# Patient Record
Sex: Male | Born: 1955 | Race: White | Hispanic: No | Marital: Married | Smoking: Former smoker
Health system: Southern US, Community
[De-identification: ages and names within clinical notes are randomized; demographics above are authoritative.]

## PROBLEM LIST (undated history)

## (undated) DIAGNOSIS — I10 Essential (primary) hypertension: Secondary | ICD-10-CM

## (undated) DIAGNOSIS — E785 Hyperlipidemia, unspecified: Secondary | ICD-10-CM

## (undated) DIAGNOSIS — Z8619 Personal history of other infectious and parasitic diseases: Secondary | ICD-10-CM

## (undated) HISTORY — DX: Personal history of other infectious and parasitic diseases: Z86.19

## (undated) HISTORY — DX: Essential (primary) hypertension: I10

## (undated) HISTORY — DX: Hyperlipidemia, unspecified: E78.5

---
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
## 1997-12-27 HISTORY — PX: VASECTOMY: SHX75

## 2008-02-04 ENCOUNTER — Emergency Department (HOSPITAL_COMMUNITY): Admission: EM | Admit: 2008-02-04 | Discharge: 2008-02-04 | Payer: Self-pay | Admitting: Emergency Medicine

## 2009-07-26 IMAGING — CR DG CHEST 2V
2 series · 2 of 2 positions shown · non-contrast
Comparison: none

CLINICAL DATA: Chest pain

Chest 2 view:
No previous for comparison. The heart size and mediastinal contours are within
normal limits.  Both lungs are clear.  The visualized skeletal structures are
unremarkable.

[view not recorded (1 of 2)]
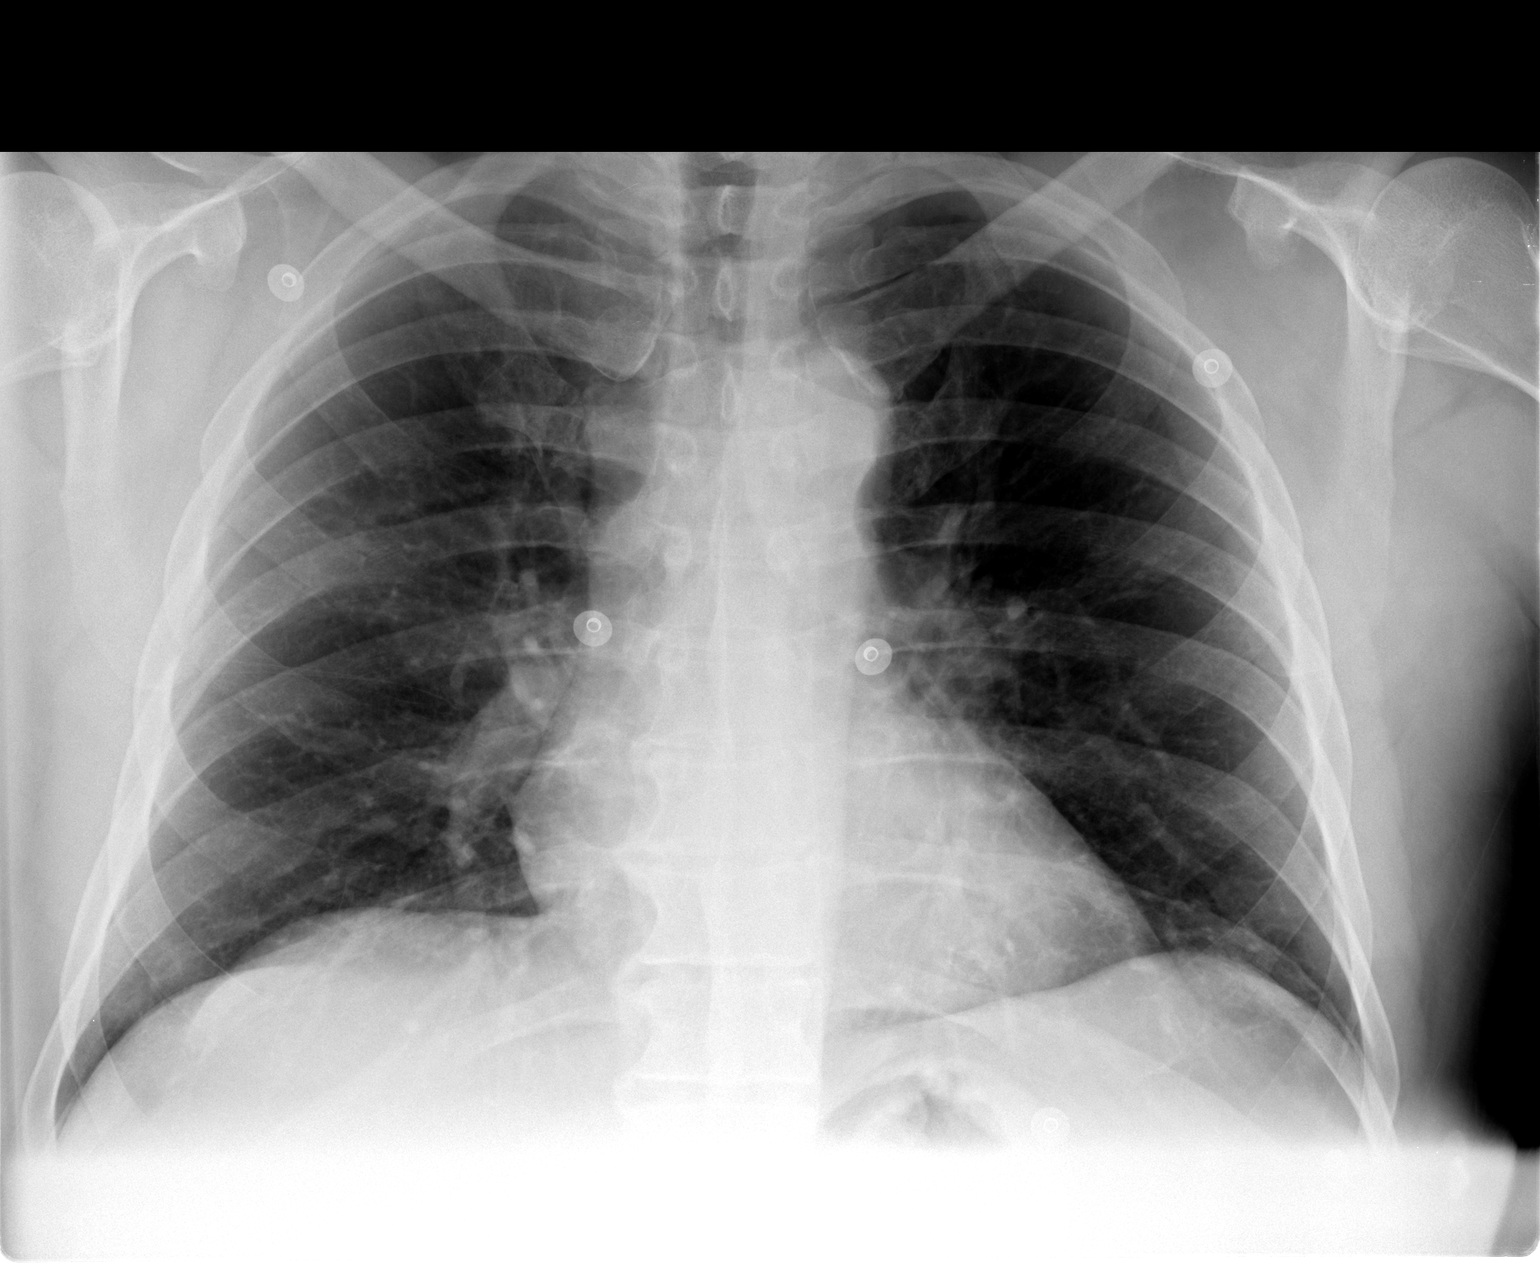

[view not recorded (2 of 2)]
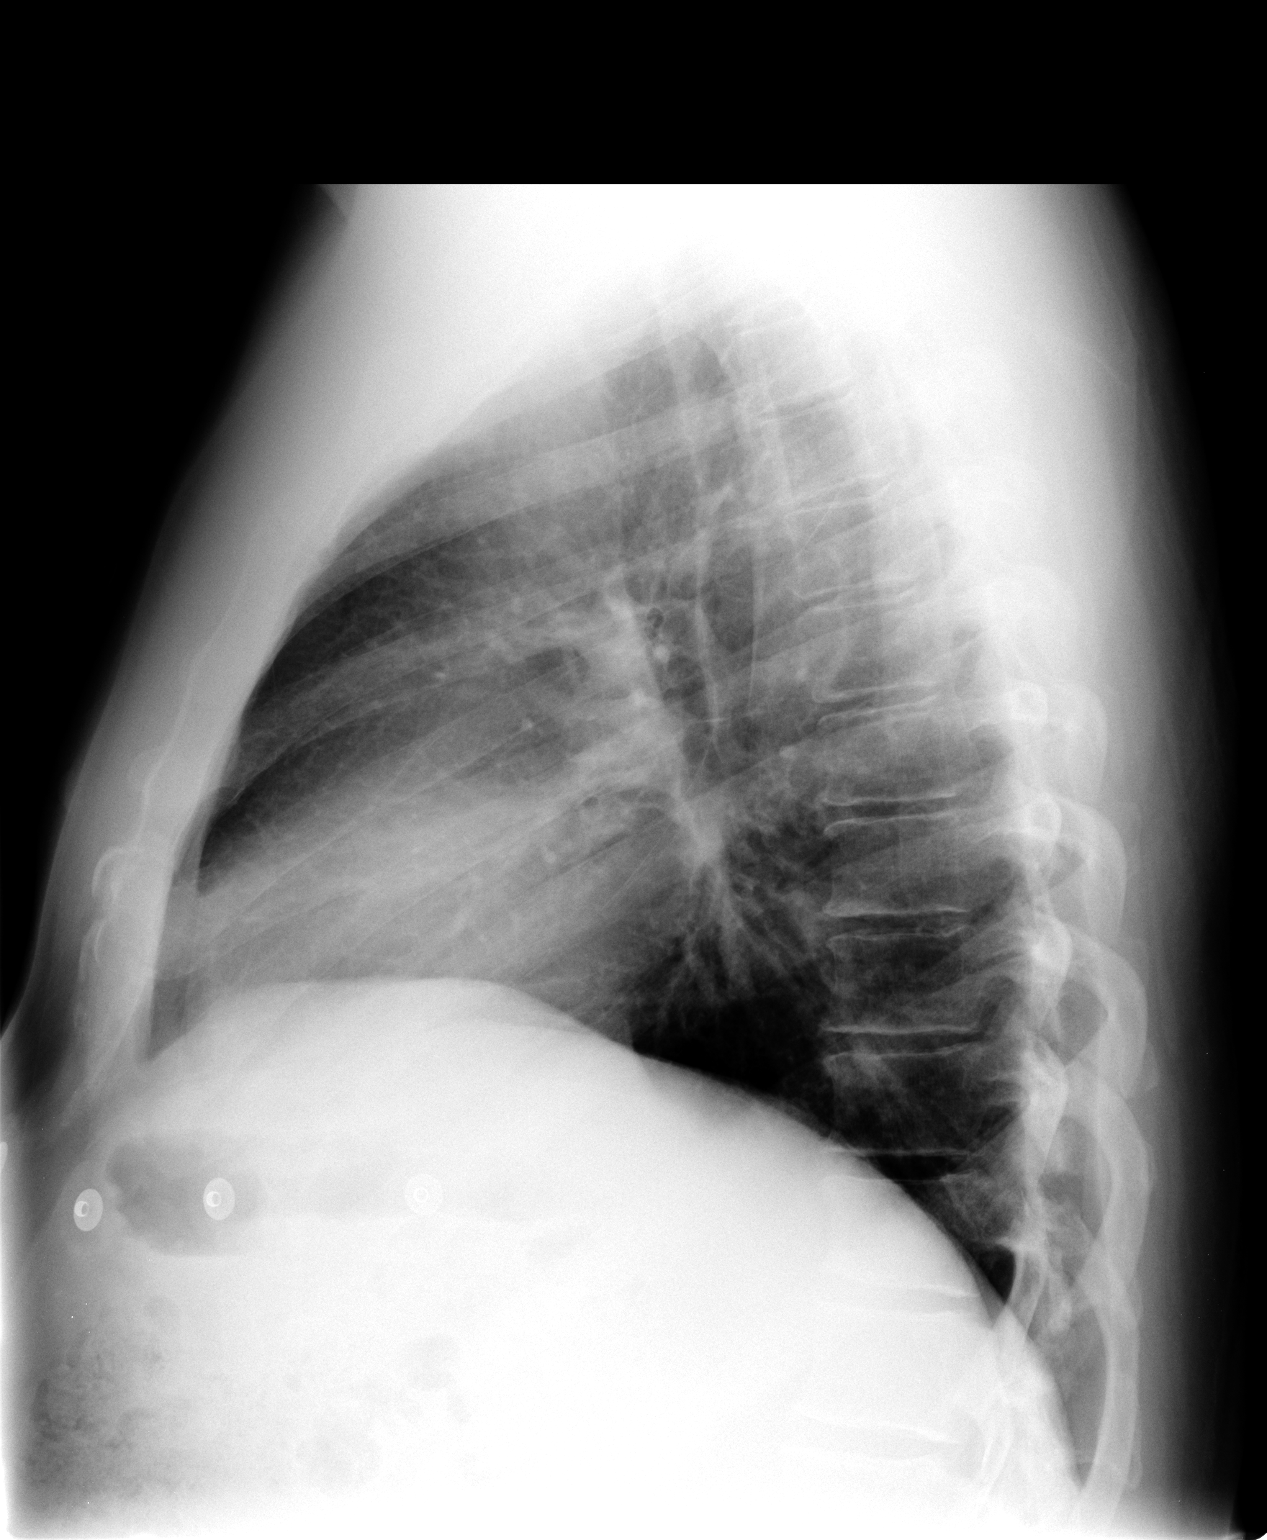

[2 of 2 positions shown; findings below may reference images not displayed]

IMPRESSION: 1. No active cardiopulmonary disease.

## 2009-09-22 ENCOUNTER — Ambulatory Visit: Payer: Self-pay | Admitting: Family Medicine

## 2009-09-22 DIAGNOSIS — G5620 Lesion of ulnar nerve, unspecified upper limb: Secondary | ICD-10-CM

## 2009-09-22 DIAGNOSIS — E785 Hyperlipidemia, unspecified: Secondary | ICD-10-CM | POA: Insufficient documentation

## 2009-09-23 DIAGNOSIS — I10 Essential (primary) hypertension: Secondary | ICD-10-CM | POA: Insufficient documentation

## 2009-10-13 ENCOUNTER — Ambulatory Visit: Payer: Self-pay | Admitting: Family Medicine

## 2009-10-13 LAB — CONVERTED CEMR LAB
ALT: 37 units/L (ref 0–53)
Albumin: 4.1 g/dL (ref 3.5–5.2)
Basophils Absolute: 0.1 10*3/uL (ref 0.0–0.1)
Basophils Relative: 0.9 % (ref 0.0–3.0)
Bilirubin, Direct: 0 mg/dL (ref 0.0–0.3)
Cholesterol: 254 mg/dL — ABNORMAL HIGH (ref 0–200)
Direct LDL: 183.5 mg/dL
Eosinophils Absolute: 0.5 10*3/uL (ref 0.0–0.7)
HDL: 38.4 mg/dL — ABNORMAL LOW (ref >39.00)
Lymphocytes Relative: 28.9 % (ref 12.0–46.0)
MCHC: 34.5 g/dL (ref 30.0–36.0)
Monocytes Relative: 9.5 % (ref 3.0–12.0)
Neutrophils Relative %: 55 % (ref 43.0–77.0)
RBC: 4.8 M/uL (ref 4.22–5.81)
Total Protein: 7.1 g/dL (ref 6.0–8.3)
Triglycerides: 189 mg/dL — ABNORMAL HIGH (ref 0.0–149.0)

## 2009-10-20 ENCOUNTER — Ambulatory Visit: Payer: Self-pay | Admitting: Family Medicine

## 2009-12-03 ENCOUNTER — Encounter: Payer: Self-pay | Admitting: Family Medicine

## 2010-01-15 ENCOUNTER — Ambulatory Visit: Payer: Self-pay | Admitting: Family Medicine

## 2010-01-19 LAB — CONVERTED CEMR LAB
ALT: 48 units/L (ref 0–53)
Alkaline Phosphatase: 49 units/L (ref 39–117)
Bilirubin, Direct: 0.1 mg/dL (ref 0.0–0.3)
Cholesterol: 197 mg/dL (ref 0–200)
Total Protein: 7.5 g/dL (ref 6.0–8.3)
VLDL: 35.2 mg/dL (ref 0.0–40.0)

## 2010-01-26 ENCOUNTER — Ambulatory Visit: Payer: Self-pay | Admitting: Family Medicine

## 2010-07-13 ENCOUNTER — Emergency Department: Payer: Self-pay | Admitting: Emergency Medicine

## 2010-07-13 ENCOUNTER — Telehealth: Payer: Self-pay | Admitting: Family Medicine

## 2010-07-17 ENCOUNTER — Ambulatory Visit: Payer: Self-pay | Admitting: Family Medicine

## 2010-07-17 DIAGNOSIS — R51 Headache: Secondary | ICD-10-CM

## 2010-07-17 DIAGNOSIS — R519 Headache, unspecified: Secondary | ICD-10-CM | POA: Insufficient documentation

## 2010-07-17 DIAGNOSIS — H538 Other visual disturbances: Secondary | ICD-10-CM

## 2011-01-26 NOTE — Assessment & Plan Note (Signed)
Summary: ROA   Vital Signs:  Patient profile:   55 year old male Height:      71 inches Weight:      220.0 pounds BMI:     30.79 Temp:     98.2 degrees F oral Pulse rate:   82 / minute Pulse rhythm:   regular BP sitting:   130 / 86  (left arm) Cuff size:   large  Vitals Entered By: Benny Lennert CMA Duncan Dull) (January 26, 2010 4:06 PM)  History of Present Illness: Chief complaint roa  55 year old male:  HTN  Lipids  Lipid Management History:      Positive NCEP/ATP III risk factors include male age 75 years old or older and hypertension.  Negative NCEP/ATP III risk factors include non-diabetic, non-tobacco-user status, no ASHD (atherosclerotic heart disease), no prior stroke/TIA, no peripheral vascular disease, and no history of aortic aneurysm.        The patient states that he knows about the "Therapeutic Lifestyle Change" diet.  His compliance with the TLC diet is poor.  The patient expresses understanding of adjunctive measures for cholesterol lowering.  Adjunctive measures started by the patient include aerobic exercise, fiber, folic acid, omega-3 supplements, limit alcohol consumpton, and weight reduction.    Clinical Review Panels:  Lipid Management   Cholesterol:  197 (01/15/2010)   LDL (bad choesterol):  119 (01/15/2010)   HDL (good cholesterol):  43.30 (01/15/2010)  CBC   WBC:  7.9 (10/13/2009)   RBC:  4.80 (10/13/2009)   Hgb:  15.1 (10/13/2009)   Hct:  43.8 (10/13/2009)   Platelets:  259.0 (10/13/2009)   MCV  91.4 (10/13/2009)   MCHC  34.5 (10/13/2009)   RDW  12.3 (10/13/2009)   PMN:  55.0 (10/13/2009)   Lymphs:  28.9 (10/13/2009)   Monos:  9.5 (10/13/2009)   Eosinophils:  5.7 (10/13/2009)   Basophil:  0.9 (10/13/2009)  Complete Metabolic Panel   Albumin:  4.2 (01/15/2010)   Total Protein:  7.5 (01/15/2010)   Total Bili:  0.9 (01/15/2010)   Alk Phos:  49 (01/15/2010)   SGPT (ALT):  48 (01/15/2010)   SGOT (AST):  32 (01/15/2010)   Allergies  (verified): No Known Drug Allergies  Past History:  Past medical, surgical, family and social histories (including risk factors) reviewed, and no changes noted (except as noted below).  Past Medical History: Reviewed history from 09/22/2009 and no changes required. HYPERLIPIDEMIA (ICD-272.4) CHICKENPOX (ICD-052.9) Hypertension  Past Surgical History: Reviewed history from 09/22/2009 and no changes required. Vasectomy 1999 Tonsillectomy with adnoids 1968  Family History: Reviewed history from 09/22/2009 and no changes required. Family History of Alcoholism/Addiction Family History of Arthritis Family History High cholesterol Family History Hypertension Family History of Sudden Death Family History of Cardiovascular disorder  CAD, mother, others Father, d/c ETOH  Social History: Reviewed history from 09/22/2009 and no changes required. Occupation:sales Married Alcohol use-yes Drug use-no Regular exercise-no has smoked in the past  Review of Systems       ROS: GEN: No acute illnesses, no fevers, chills, sweats, fatigue, weight loss, or URI sx. GI: No n/v/d Pulm: No SOB, cough, wheezing Interactive and getting along well at home.  Otherwise, ROS is as per the HPI.   Physical Exam  General:  GEN: WDWN, NAD, Non-toxic, A & O x 3 HEENT: Atraumatic, Normocephalic. Neck supple. No masses, No LAD. Ears and Nose: No external deformity. CV: RRR, No M/G/R. No JVD. No thrill. No extra heart sounds. PULM: CTA B, no  wheezes, crackles, rhonchi. No retractions. No resp. distress. No accessory muscle use. EXTR: No c/c/e NEURO: Normal gait.  PSYCH: Normally interactive. Conversant. Not depressed or anxious appearing.  Calm demeanor.     Impression & Recommendations:  Problem # 1:  HYPERTENSION (ICD-401.9) Assessment Improved  His updated medication list for this problem includes:    Hydrochlorothiazide 12.5 Mg Caps (Hydrochlorothiazide) .Marland Kitchen... 1 by mouth daily  Problem  # 2:  HYPERLIPIDEMIA (ICD-272.4) Assessment: Improved  His updated medication list for this problem includes:    Simvastatin 40 Mg Tabs (Simvastatin) .Marland Kitchen... 1 by mouth at bedtime  Complete Medication List: 1)  Simvastatin 40 Mg Tabs (Simvastatin) .Marland Kitchen.. 1 by mouth at bedtime 2)  Hydrochlorothiazide 12.5 Mg Caps (Hydrochlorothiazide) .Marland Kitchen.. 1 by mouth daily  Lipid Assessment/Plan:      The patient's calculated 10 year coronary heart disease risk is 11 %.  Based on NCEP/ATP III, the patient's risk factor category is "2 or more risk factors and a calculated 10 year CAD risk of < 20%".  From this information, the patient's calculated lipid goals are as follows: Total cholesterol goal is 200; LDL cholesterol goal is 130; HDL cholesterol goal is 40; Triglyceride goal is 150.  His LDL cholesterol goal has been met.    Patient Instructions: 1)  LORATIDINE, GENERIC CLARITIN 2)  GENERIC ZYRTEC 3)  Gen-Teal eye drops 4)  ALL OVER THE COUNTER 5)  CPX in November 6)  Prephysical Labs, several days before, fasting 7)  BMP, HFP, FLP, CBC with diff, TSH, PSA: 272.4, v77.1, ,780.79, v76.44   Current Allergies (reviewed today): No known allergies

## 2011-01-26 NOTE — Assessment & Plan Note (Signed)
Summary: EPISODE OF BLURRED VISION, HEADACHE   Vital Signs:  Patient profile:   55 year old male Height:      71 inches Weight:      217.4 pounds BMI:     30.43 Temp:     98.4 degrees F oral Pulse rate:   82 / minute Pulse rhythm:   regular BP sitting:   120 / 70  (left arm) Cuff size:   large  Vitals Entered By: Benny Lennert CMA Duncan Dull) (July 17, 2010 4:04 PM)  History of Present Illness: Chief complaint episode of blurred vision and headache  In last few months...when using laptop..note bright light aura similar to prior to migraine in past. Than 2 weeks ago..had nausea and headache, describes as pressure behind eyes. Last week..looked at bright light...felt like eyes couldn't readjust...left eye was blurred (describes more as tunnel vision that he noted in past priro to migraine), resolved after 30 minutes. Went to ER...at [facility]...left after 3 hours never been seen. BP was slightly elevated. Describes headhace as mild.  Has tried tylenol for headache.  No further symptoms since restarting wearing glases again.  Has been under more stress lately, anxiety, also seems to only occur when on computer.  (all these times has not been wearing glasses) He has not been wearing glasses in last month). Has history of migraines in 1980s. No falls, no head injury  No numbness, no weakness, no slurred speech.  HAs risk factors for CVA..high chol, HTN..but no Family hist, no tobas=cco use.  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 1 By Mouth At Bedtime 2)  Hydrochlorothiazide  12.5 Mg Caps (Hydrochlorothiazide) .Marland Kitchen.. 1 By Mouth Daily  Allergies (verified): No Known Drug Allergies  Past History:  Past medical, surgical, family and social histories (including risk factors) reviewed, and no changes noted (except as noted below).  Past Medical History: Reviewed history from 09/22/2009 and no changes required. HYPERLIPIDEMIA (ICD-272.4) CHICKENPOX (ICD-052.9) Hypertension  Past Surgical History: Reviewed history from 09/22/2009 and no changes required. Vasectomy 1999 Tonsillectomy with adnoids 1968  Family History: Reviewed history from 09/22/2009 and no changes required. Family History of Alcoholism/Addiction Family History of Arthritis Family History High cholesterol Family History Hypertension Family History of Sudden Death Family History of Cardiovascular disorder  CAD, mother, others Father, d/c ETOH  Social History: Reviewed history from 09/22/2009 and no changes required. Occupation:sales Married Alcohol use-yes Drug use-no Regular exercise-no has smoked in the past  Review of Systems General:  Denies fatigue and fever. CV:  Denies chest pain or discomfort. Resp:  Denies shortness of breath. GI:  Denies abdominal pain. GU:  Denies dysuria.  Physical Exam  General:  Well-developed,well-nourished,in no acute distress; alert,appropriate and cooperative throughout examination Head:  Normocephalic and atraumatic without obvious abnormalities. No apparent alopecia or balding. Eyes:  No corneal or conjunctival inflammation noted. EOMI. Perrla. Funduscopic exam benign, without hemorrhages, exudates or papilledema. Vision grossly normal. Ears:  External ear exam shows no significant lesions or deformities.  Otoscopic examination reveals clear canals, tympanic membranes are intact bilaterally without bulging, retraction, inflammation or discharge. Hearing is grossly normal bilaterally. Nose:  External  nasal examination shows no deformity or  inflammation. Nasal mucosa are pink and moist without lesions or exudates. Mouth:  Oral mucosa and oropharynx without lesions or exudates.  Teeth in good repair. Neck:  no carotid bruit or thyromegaly no cervical or supraclavicular lymphadenopathy  Lungs:  Normal respiratory effort, chest expands symmetrically. Lungs are clear to auscultation, no crackles or wheezes. Heart:  Normal rate and regular rhythm. S1 and S2 normal without gallop, murmur, click, rub or other extra sounds. Pulses:  R and L posterior tibial pulses are full and equal bilaterally  Extremities:  noedema Neurologic:  No cranial nerve deficits noted. Station and gait are normal. Plantar reflexes are down-going bilaterally. DTRs are symmetrical throughout. Sensory, motor and coordinative functions appear intact.   Impression & Recommendations:  Problem # 1:  BLURRED VISION, INTERMITTENT (ICD-368.8) Symtpoms most consitent with atypical migraine. He has nml neuro exam and symptoms,mptoms seem to be related to computer work and not wearing his reading glasses.  Other less liekly possibilites would be TIA, aneurysm, seizure, amarosis fugax from carotid stenosis...but his risk factors of chol and BP are well controlled..no bruit heard on exam and headche is mild to moderate not severe.  I recommend continued riask factor modification, but if any visual symptoms recur..I would strongly suggest carotid dopplers, ECHO and possibly MRi brain. I did discuss MRI brain with pt today as how TIA may not even be seen on MRI. Total visit time > 50% spent counseling and discussing patients care .  Will CC: Dr. Patsy Lager for his input given he knows pt better.  Problem # 2:  HEADACHE (ICD-784.0)  Complete Medication List: 1)  Simvastatin 40 Mg Tabs (Simvastatin) .Marland Kitchen.. 1 by mouth at bedtime 2)  Hydrochlorothiazide 12.5 Mg Caps (Hydrochlorothiazide) .Marland Kitchen.. 1 by mouth daily  Patient Instructions: 1)   Make sure drinking enough water, sleep  and not skipping meals. 2)  Most likely symptoms are due to atypical migraine triggered by computer work/eye strain not wearing glasses. 3)  Call if any further visual changes despite wearing glasses. 4)  To consider work up for risk factor for ministroke..we would recommend carotid dopplers, ECHO.  Current Allergies (reviewed today): No known allergies

## 2011-01-26 NOTE — Progress Notes (Signed)
Summary: Triage Call  Phone Note Outgoing Call   Call placed by: Melody Comas,  July 13, 2010 2:18 PM Call placed to: Patient Summary of Call: Pt called, having blurred vision for the last 30 minutes.  No pain, no dizzness, says he was diag w/ migrane headaches years ago, but has not had a headache in years. Pt say this is the second time this has happen in the last couple of weeks.  Nothing available w/ any physician until Wednesday, July 20th.Marland KitchenMarland KitchenPts advise.. Call back # [phone].Marland KitchenDaine Gip 07-13-2010, 2:21pm...  Follow-up for Phone Call        call -- i think that this sounds like a case that urgent evaluation today. I can't really work in another pt, please help Follow-up by: Hannah Beat MD,  July 13, 2010 2:32 PM  Additional Follow-up for Phone Call Additional follow up Details #1::        Left message on machine for patient to call back. Sydell Axon LPN  July 13, 2010 2:35 PM  Patient notified as instructed by telephone. Was informed by patient that his vision has cleared up and that he just has a slight headache and took some tylenol which has helped. Patient stated that he is working and is on his way to [facility] and can not stop to seek medical care now. Patient stated that if symptoms return he will seek medical care immediately. Additional Follow-up by: Sydell Axon LPN,  July 13, 2010 3:44 PM    Additional Follow-up for Phone Call Additional follow up Details #2::    reasonable Follow-up by: Hannah Beat MD,  July 13, 2010 3:47 PM   Appended Document: Triage Call Spoke with pt.  He went to ER last night but ended up leaving after four and a half hours without being seen.  He is feeling better today.  Scheduled appt to see Dr. Ermalene Searing on friday.

## 2011-04-21 ENCOUNTER — Ambulatory Visit: Payer: Self-pay | Admitting: Anesthesiology

## 2011-04-23 ENCOUNTER — Ambulatory Visit: Payer: Self-pay | Admitting: Orthopedic Surgery

## 2011-04-30 ENCOUNTER — Ambulatory Visit: Payer: Self-pay | Admitting: Internal Medicine

## 2011-04-30 ENCOUNTER — Emergency Department: Payer: Self-pay | Admitting: Emergency Medicine

## 2011-05-15 ENCOUNTER — Other Ambulatory Visit: Payer: Self-pay | Admitting: Family Medicine

## 2011-06-29 ENCOUNTER — Other Ambulatory Visit: Payer: Self-pay | Admitting: Family Medicine

## 2011-06-29 NOTE — Telephone Encounter (Signed)
Patients not seen in 1 year please advise

## 2011-06-29 NOTE — Telephone Encounter (Signed)
Ok to refill #90, 0 refills  Help set up CPX, 30 mins during summer

## 2011-08-02 ENCOUNTER — Encounter: Payer: Self-pay | Admitting: Family Medicine

## 2011-08-05 ENCOUNTER — Encounter: Payer: Self-pay | Admitting: Family Medicine

## 2011-09-27 ENCOUNTER — Other Ambulatory Visit: Payer: Self-pay | Admitting: Family Medicine

## 2012-10-19 IMAGING — CR DG ABDOMEN 1V
1 series · 2 of 2 positions shown · non-contrast
Comparison: none

REASON FOR EXAM: abd pain and constipation    Flex 8
COMMENTS:   LMP: (Male)

PROCEDURE:     DXR - DXR KIDNEY URETER BLADDER  - April 30, 2011  [DATE]
RESULT:     Comparison: None.

[Series 1: view not recorded · 0.17mm/px · 2 of 2 slices shown]
[im 1/2]
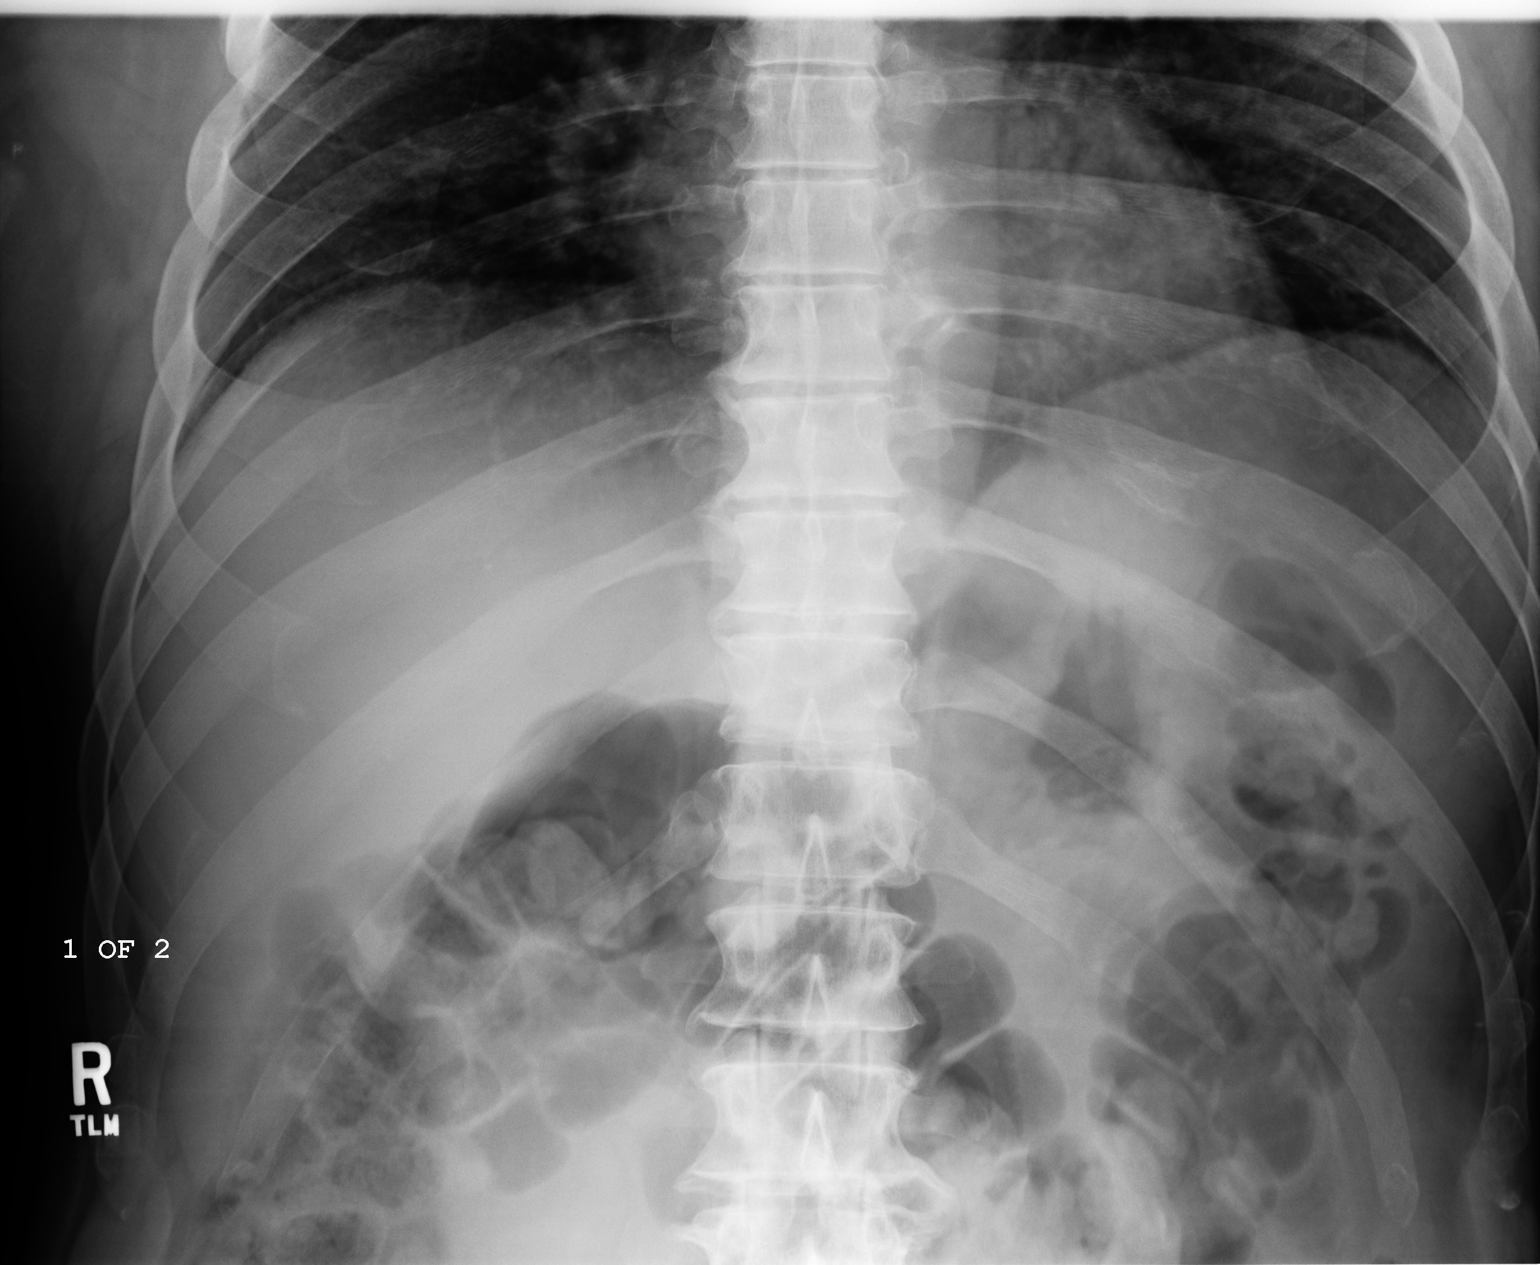
[im 2/2]
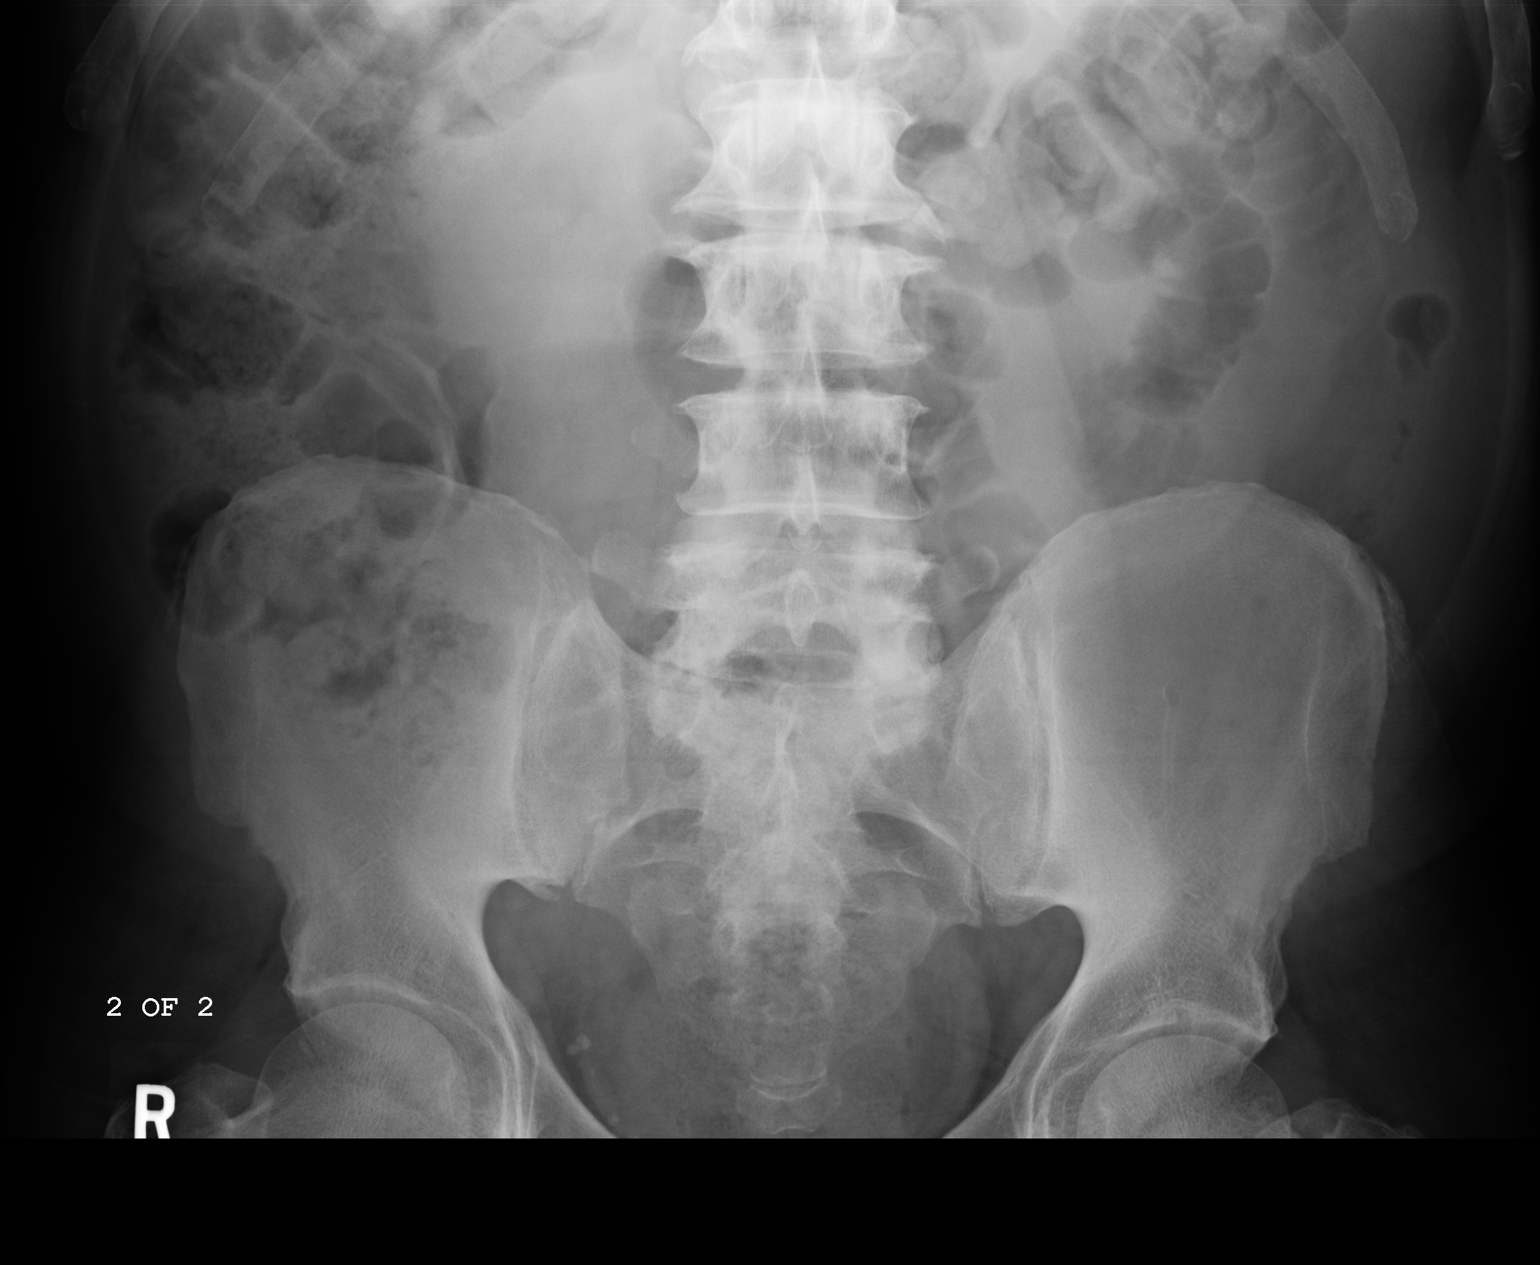

[2 of 2 positions shown; findings below may reference images not displayed]

FINDINGS: Supine technique limits evaluation for free intraperitoneal air. Air is seen
within nondilated small and large bowel. Stool seen in the ascending colon
and rectum.
IMPRESSION: Nonobstructed bowel gas pattern.

## 2020-04-04 ENCOUNTER — Ambulatory Visit: Payer: Self-pay | Attending: Internal Medicine

## 2020-04-04 DIAGNOSIS — Z23 Encounter for immunization: Secondary | ICD-10-CM

## 2020-04-04 NOTE — Progress Notes (Signed)
   Covid-19 Vaccination Clinic  Name:  Jacky Dross    MRN: [id number] DOB: April 23, 1956  04/04/2020  Mr. Yurkovich was observed post Covid-19 immunization for 15 minutes without incident. He was provided with Vaccine Information Sheet and instruction to access the V-Safe system.   Mr. Bastin was instructed to call 911 with any severe reactions post vaccine: Marland Kitchen Difficulty breathing  . Swelling of face and throat  . A fast heartbeat  . A bad rash all over body  . Dizziness and weakness   Immunizations Administered    Name Date Dose VIS Date Route   Pfizer COVID-19 Vaccine 04/04/2020  9:51 AM 0.3 mL 12/07/2019 Intramuscular   Manufacturer: [employer], [employer]   Lot: [id number]   NDC: [address]-8

## 2020-04-29 ENCOUNTER — Ambulatory Visit: Payer: Self-pay | Attending: Internal Medicine

## 2020-04-29 DIAGNOSIS — Z23 Encounter for immunization: Secondary | ICD-10-CM

## 2020-04-29 NOTE — Progress Notes (Signed)
   Covid-19 Vaccination Clinic  Name:  Ronnie Potter    MRN: [id number] DOB: 1956-11-30  04/29/2020  Mr. [name] was observed post Covid-19 immunization for 15 minutes without incident. He was provided with Vaccine Information Sheet and instruction to access the V-Safe system.   Mr. [name] was instructed to call 911 with any severe reactions post vaccine: Marland Kitchen Difficulty breathing  . Swelling of face and throat  . A fast heartbeat  . A bad rash all over body  . Dizziness and weakness   Immunizations Administered    Name Date Dose VIS Date Route   Pfizer COVID-19 Vaccine 04/29/2020 10:24 AM 0.3 mL 02/20/2019 Intramuscular   Manufacturer: [employer], [employer]   Lot: [id number]   NDC: [address]-9
